# Patient Record
Sex: Female | Born: 1995 | Race: White | Hispanic: No | Marital: Single | Smoking: Never smoker
Health system: Southern US, Community
[De-identification: ages and names within clinical notes are randomized; demographics above are authoritative.]

## PROBLEM LIST (undated history)

## (undated) DIAGNOSIS — F32A Depression, unspecified: Secondary | ICD-10-CM

## (undated) DIAGNOSIS — E282 Polycystic ovarian syndrome: Secondary | ICD-10-CM

## (undated) DIAGNOSIS — F329 Major depressive disorder, single episode, unspecified: Secondary | ICD-10-CM

---
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
## 2022-09-09 ENCOUNTER — Encounter (HOSPITAL_BASED_OUTPATIENT_CLINIC_OR_DEPARTMENT_OTHER): Payer: Self-pay | Admitting: Emergency Medicine

## 2022-09-09 ENCOUNTER — Other Ambulatory Visit (HOSPITAL_BASED_OUTPATIENT_CLINIC_OR_DEPARTMENT_OTHER): Payer: Self-pay

## 2022-09-09 ENCOUNTER — Emergency Department (HOSPITAL_COMMUNITY): Payer: BC Managed Care – PPO | Admitting: Anesthesiology

## 2022-09-09 ENCOUNTER — Other Ambulatory Visit: Payer: Self-pay

## 2022-09-09 ENCOUNTER — Encounter (HOSPITAL_COMMUNITY)
Admission: EM | Disposition: A | Discharge status: Home / Self Care | Payer: Self-pay | Source: Home / Self Care | Attending: Emergency Medicine

## 2022-09-09 ENCOUNTER — Observation Stay (HOSPITAL_BASED_OUTPATIENT_CLINIC_OR_DEPARTMENT_OTHER)
Admission: EM | Admit: 2022-09-09 | Discharge: 2022-09-10 | Disposition: A | Discharge status: Home / Self Care | Payer: BC Managed Care – PPO | Attending: Surgery | Admitting: Surgery

## 2022-09-09 ENCOUNTER — Emergency Department (HOSPITAL_BASED_OUTPATIENT_CLINIC_OR_DEPARTMENT_OTHER): Payer: BC Managed Care – PPO

## 2022-09-09 ENCOUNTER — Observation Stay (HOSPITAL_COMMUNITY): Payer: BC Managed Care – PPO

## 2022-09-09 DIAGNOSIS — R1010 Upper abdominal pain, unspecified: Principal | ICD-10-CM

## 2022-09-09 DIAGNOSIS — K8012 Calculus of gallbladder with acute and chronic cholecystitis without obstruction: Principal | ICD-10-CM | POA: Insufficient documentation

## 2022-09-09 DIAGNOSIS — K819 Cholecystitis, unspecified: Secondary | ICD-10-CM | POA: Diagnosis not present

## 2022-09-09 DIAGNOSIS — K810 Acute cholecystitis: Secondary | ICD-10-CM | POA: Diagnosis present

## 2022-09-09 DIAGNOSIS — K8020 Calculus of gallbladder without cholecystitis without obstruction: Secondary | ICD-10-CM

## 2022-09-09 DIAGNOSIS — R7989 Other specified abnormal findings of blood chemistry: Secondary | ICD-10-CM

## 2022-09-09 DIAGNOSIS — R1011 Right upper quadrant pain: Secondary | ICD-10-CM | POA: Diagnosis present

## 2022-09-09 HISTORY — PX: CHOLECYSTECTOMY: SHX55

## 2022-09-09 LAB — COMPREHENSIVE METABOLIC PANEL
ALT: 175 U/L — ABNORMAL HIGH (ref 0–44)
AST: 253 U/L — ABNORMAL HIGH (ref 15–41)
Albumin: 4.5 g/dL (ref 3.5–5.0)
Alkaline Phosphatase: 79 U/L (ref 38–126)
Anion gap: 11 (ref 5–15)
BUN: 5 mg/dL — ABNORMAL LOW (ref 6–20)
CO2: 23 mmol/L (ref 22–32)
Calcium: 9.6 mg/dL (ref 8.9–10.3)
Chloride: 104 mmol/L (ref 98–111)
Creatinine, Ser: 0.58 mg/dL (ref 0.44–1.00)
GFR, Estimated: >60 mL/min (ref >60)
Glucose, Bld: 114 mg/dL — ABNORMAL HIGH (ref 70–99)
Potassium: 3.9 mmol/L (ref 3.5–5.1)
Sodium: 138 mmol/L (ref 135–145)
Total Bilirubin: 1.4 mg/dL — ABNORMAL HIGH (ref 0.3–1.2)
Total Protein: 7.4 g/dL (ref 6.5–8.1)

## 2022-09-09 LAB — URINALYSIS, ROUTINE W REFLEX MICROSCOPIC
Bilirubin Urine: NEGATIVE
Glucose, UA: NEGATIVE mg/dL
Hgb urine dipstick: NEGATIVE
Ketones, ur: 40 mg/dL — AB
Nitrite: NEGATIVE
Protein, ur: 30 mg/dL — AB
Specific Gravity, Urine: 1.023 (ref 1.005–1.030)
pH: 8.5 — ABNORMAL HIGH (ref 5.0–8.0)

## 2022-09-09 LAB — CBC
HCT: 41.5 % (ref 36.0–46.0)
Hemoglobin: 14.2 g/dL (ref 12.0–15.0)
MCH: 29.5 pg (ref 26.0–34.0)
MCHC: 34.2 g/dL (ref 30.0–36.0)
MCV: 86.3 fL (ref 80.0–100.0)
Platelets: 156 10*3/uL (ref 150–400)
RBC: 4.81 MIL/uL (ref 3.87–5.11)
RDW: 12.8 % (ref 11.5–15.5)
WBC: 7.5 10*3/uL (ref 4.0–10.5)
nRBC: 0 % (ref 0.0–0.2)

## 2022-09-09 LAB — RAPID URINE DRUG SCREEN, HOSP PERFORMED
Amphetamines: NOT DETECTED
Barbiturates: NOT DETECTED
Benzodiazepines: NOT DETECTED
Cocaine: NOT DETECTED
Opiates: NOT DETECTED
Tetrahydrocannabinol: POSITIVE — AB

## 2022-09-09 LAB — PREGNANCY, URINE: Preg Test, Ur: NEGATIVE

## 2022-09-09 LAB — LIPASE, BLOOD: Lipase: 16 U/L (ref 11–51)

## 2022-09-09 SURGERY — LAPAROSCOPIC CHOLECYSTECTOMY WITH INTRAOPERATIVE CHOLANGIOGRAM
Anesthesia: General

## 2022-09-09 MED ORDER — ONDANSETRON 4 MG PO TBDP: 4.0000 mg | ORAL_TABLET | Freq: Four times a day (QID) | ORAL | Status: DC | PRN

## 2022-09-09 MED ORDER — BUPIVACAINE HCL 0.25 % IJ SOLN
INTRAMUSCULAR | Status: DC | PRN
  Administered 2022-09-09: 30 mL

## 2022-09-09 MED ORDER — SIMETHICONE 80 MG PO CHEW: 40.0000 mg | CHEWABLE_TABLET | Freq: Four times a day (QID) | ORAL | Status: DC | PRN

## 2022-09-09 MED ORDER — OXYCODONE HCL 5 MG/5ML PO SOLN: 5.0000 mg | Freq: Once | ORAL | Status: DC | PRN

## 2022-09-09 MED ORDER — KETOROLAC TROMETHAMINE 30 MG/ML IJ SOLN: 30.0000 mg | Freq: Once | INTRAMUSCULAR | Status: DC | PRN

## 2022-09-09 MED ORDER — SCOPOLAMINE 1 MG/3DAYS TD PT72
MEDICATED_PATCH | TRANSDERMAL | Status: AC
  Administered 2022-09-09: 1.5 mg via TRANSDERMAL
  Filled 2022-09-09: qty 1

## 2022-09-09 MED ORDER — METOPROLOL TARTRATE 5 MG/5ML IV SOLN: 5.0000 mg | Freq: Four times a day (QID) | INTRAVENOUS | Status: DC | PRN

## 2022-09-09 MED ORDER — AMISULPRIDE (ANTIEMETIC) 5 MG/2ML IV SOLN: 10.0000 mg | Freq: Once | INTRAVENOUS | Status: DC | PRN

## 2022-09-09 MED ORDER — MORPHINE SULFATE (PF) 2 MG/ML IV SOLN
1.0000 mg | INTRAVENOUS | Status: DC | PRN
  Administered 2022-09-09: 2 mg via INTRAVENOUS
  Filled 2022-09-09: qty 1

## 2022-09-09 MED ORDER — MIDAZOLAM HCL 2 MG/2ML IJ SOLN
INTRAMUSCULAR | Status: AC
  Filled 2022-09-09: qty 2

## 2022-09-09 MED ORDER — MELATONIN 3 MG PO TABS: 3.0000 mg | ORAL_TABLET | Freq: Every evening | ORAL | Status: DC | PRN

## 2022-09-09 MED ORDER — DIPHENHYDRAMINE HCL 25 MG PO CAPS: 25.0000 mg | ORAL_CAPSULE | Freq: Four times a day (QID) | ORAL | Status: DC | PRN

## 2022-09-09 MED ORDER — CEFAZOLIN SODIUM-DEXTROSE 2-4 GM/100ML-% IV SOLN
2.0000 g | Freq: Once | INTRAVENOUS | Status: AC
  Administered 2022-09-09: 2 g via INTRAVENOUS
  Filled 2022-09-09 (×2): qty 100

## 2022-09-09 MED ORDER — ONDANSETRON HCL 4 MG/2ML IJ SOLN
4.0000 mg | Freq: Four times a day (QID) | INTRAMUSCULAR | Status: DC | PRN
  Administered 2022-09-09: 4 mg via INTRAVENOUS
  Filled 2022-09-09: qty 2

## 2022-09-09 MED ORDER — SODIUM CHLORIDE 0.9 % IV SOLN
INTRAVENOUS | Status: DC | PRN
  Administered 2022-09-09: 7 mL

## 2022-09-09 MED ORDER — ORAL CARE MOUTH RINSE: 15.0000 mL | Freq: Once | OROMUCOSAL | Status: AC

## 2022-09-09 MED ORDER — KCL IN DEXTROSE-NACL 20-5-0.45 MEQ/L-%-% IV SOLN
INTRAVENOUS | Status: DC
  Filled 2022-09-09 (×2): qty 1000

## 2022-09-09 MED ORDER — PROPOFOL 10 MG/ML IV BOLUS
INTRAVENOUS | Status: DC | PRN
  Administered 2022-09-09: 150 mg via INTRAVENOUS

## 2022-09-09 MED ORDER — CHLORHEXIDINE GLUCONATE 0.12 % MT SOLN
15.0000 mL | Freq: Once | OROMUCOSAL | Status: AC
  Administered 2022-09-09: 15 mL via OROMUCOSAL
  Filled 2022-09-09: qty 15

## 2022-09-09 MED ORDER — HYDRALAZINE HCL 20 MG/ML IJ SOLN: 10.0000 mg | INTRAMUSCULAR | Status: DC | PRN

## 2022-09-09 MED ORDER — MORPHINE SULFATE (PF) 4 MG/ML IV SOLN
4.0000 mg | Freq: Once | INTRAVENOUS | Status: AC
  Administered 2022-09-09: 4 mg via INTRAVENOUS
  Filled 2022-09-09: qty 1

## 2022-09-09 MED ORDER — OXYCODONE HCL 5 MG PO TABS: 5.0000 mg | ORAL_TABLET | ORAL | Status: DC | PRN

## 2022-09-09 MED ORDER — POLYETHYLENE GLYCOL 3350 17 G PO PACK: 17.0000 g | PACK | Freq: Every day | ORAL | Status: DC | PRN

## 2022-09-09 MED ORDER — FENTANYL CITRATE (PF) 250 MCG/5ML IJ SOLN
INTRAMUSCULAR | Status: DC | PRN
  Administered 2022-09-09: 100 ug via INTRAVENOUS
  Administered 2022-09-09: 150 ug via INTRAVENOUS

## 2022-09-09 MED ORDER — LACTATED RINGERS IV BOLUS
1000.0000 mL | Freq: Once | INTRAVENOUS | Status: AC
  Administered 2022-09-09: 1000 mL via INTRAVENOUS

## 2022-09-09 MED ORDER — DEXMEDETOMIDINE HCL IN NACL 80 MCG/20ML IV SOLN
INTRAVENOUS | Status: DC | PRN
  Administered 2022-09-09: 12 ug via INTRAVENOUS
  Administered 2022-09-09: 8 ug via INTRAVENOUS

## 2022-09-09 MED ORDER — ONDANSETRON HCL 4 MG/2ML IJ SOLN
4.0000 mg | Freq: Once | INTRAMUSCULAR | Status: AC
  Administered 2022-09-09: 4 mg via INTRAVENOUS
  Filled 2022-09-09: qty 2

## 2022-09-09 MED ORDER — OXYCODONE HCL 5 MG PO TABS: 5.0000 mg | ORAL_TABLET | Freq: Once | ORAL | Status: DC | PRN

## 2022-09-09 MED ORDER — ACETAMINOPHEN 500 MG PO TABS
1000.0000 mg | ORAL_TABLET | Freq: Four times a day (QID) | ORAL | Status: DC
  Administered 2022-09-09 – 2022-09-10 (×3): 1000 mg via ORAL
  Filled 2022-09-09 (×4): qty 2

## 2022-09-09 MED ORDER — SCOPOLAMINE 1 MG/3DAYS TD PT72: 1.0000 | MEDICATED_PATCH | Freq: Once | TRANSDERMAL | Status: AC

## 2022-09-09 MED ORDER — BUPIVACAINE HCL (PF) 0.25 % IJ SOLN
INTRAMUSCULAR | Status: AC
  Filled 2022-09-09: qty 30

## 2022-09-09 MED ORDER — DEXAMETHASONE SODIUM PHOSPHATE 10 MG/ML IJ SOLN
INTRAMUSCULAR | Status: DC | PRN
  Administered 2022-09-09: 10 mg via INTRAVENOUS

## 2022-09-09 MED ORDER — MIDAZOLAM HCL 2 MG/2ML IJ SOLN
INTRAMUSCULAR | Status: DC | PRN
  Administered 2022-09-09: 2 mg via INTRAVENOUS

## 2022-09-09 MED ORDER — ROCURONIUM BROMIDE 100 MG/10ML IV SOLN
INTRAVENOUS | Status: DC | PRN
  Administered 2022-09-09: 10 mg via INTRAVENOUS
  Administered 2022-09-09: 50 mg via INTRAVENOUS

## 2022-09-09 MED ORDER — SUGAMMADEX SODIUM 200 MG/2ML IV SOLN
INTRAVENOUS | Status: DC | PRN
  Administered 2022-09-09: 200 mg via INTRAVENOUS

## 2022-09-09 MED ORDER — FENTANYL CITRATE (PF) 100 MCG/2ML IJ SOLN: 25.0000 ug | INTRAMUSCULAR | Status: DC | PRN

## 2022-09-09 MED ORDER — KETOROLAC TROMETHAMINE 30 MG/ML IJ SOLN
30.0000 mg | Freq: Four times a day (QID) | INTRAMUSCULAR | Status: DC
  Administered 2022-09-09 – 2022-09-10 (×3): 30 mg via INTRAVENOUS
  Filled 2022-09-09 (×3): qty 1

## 2022-09-09 MED ORDER — CELECOXIB 200 MG PO CAPS
200.0000 mg | ORAL_CAPSULE | ORAL | Status: AC
  Administered 2022-09-09: 200 mg via ORAL
  Filled 2022-09-09: qty 1

## 2022-09-09 MED ORDER — ENOXAPARIN SODIUM 40 MG/0.4ML IJ SOSY: 40.0000 mg | PREFILLED_SYRINGE | INTRAMUSCULAR | Status: DC

## 2022-09-09 MED ORDER — LIDOCAINE 2% (20 MG/ML) 5 ML SYRINGE
INTRAMUSCULAR | Status: DC | PRN
  Administered 2022-09-09: 60 mg via INTRAVENOUS

## 2022-09-09 MED ORDER — DIPHENHYDRAMINE HCL 50 MG/ML IJ SOLN: 25.0000 mg | Freq: Four times a day (QID) | INTRAMUSCULAR | Status: DC | PRN

## 2022-09-09 MED ORDER — FENTANYL CITRATE (PF) 250 MCG/5ML IJ SOLN
INTRAMUSCULAR | Status: AC
  Filled 2022-09-09: qty 5

## 2022-09-09 MED ORDER — LACTATED RINGERS IV SOLN: INTRAVENOUS | Status: DC

## 2022-09-09 MED ORDER — PROMETHAZINE HCL 25 MG/ML IJ SOLN: 6.2500 mg | INTRAMUSCULAR | Status: DC | PRN

## 2022-09-09 MED ORDER — ONDANSETRON HCL 4 MG/2ML IJ SOLN
INTRAMUSCULAR | Status: DC | PRN
  Administered 2022-09-09: 4 mg via INTRAVENOUS

## 2022-09-09 MED ORDER — ACETAMINOPHEN 500 MG PO TABS
1000.0000 mg | ORAL_TABLET | ORAL | Status: AC
  Administered 2022-09-09: 1000 mg via ORAL
  Filled 2022-09-09: qty 2

## 2022-09-09 SURGICAL SUPPLY — 55 items
ADH SKN CLS APL DERMABOND .7 (GAUZE/BANDAGES/DRESSINGS) ×1
APL PRP STRL LF DISP 70% ISPRP (MISCELLANEOUS) ×1
APPLIER CLIP ROT 10 11.4 M/L (STAPLE) ×1
APR CLP MED LRG 11.4X10 (STAPLE) ×1
BAG COUNTER SPONGE SURGICOUNT (BAG) ×1 IMPLANT
BAG SPEC RTRVL 10 TROC 200 (ENDOMECHANICALS) ×1
BAG SPNG CNTER NS LX DISP (BAG) ×1
CANISTER SUCT 3000ML PPV (MISCELLANEOUS) ×1 IMPLANT
CATH URET 5FR 28IN OPEN ENDED (CATHETERS) ×1 IMPLANT
CATH URETL OPEN 5X70 (CATHETERS) ×1 IMPLANT
CATH URETL OPEN END 6FR 70 (CATHETERS) ×1 IMPLANT
CHLORAPREP W/TINT 26 (MISCELLANEOUS) ×1 IMPLANT
CLIP APPLIE ROT 10 11.4 M/L (STAPLE) ×1 IMPLANT
COVER MAYO STAND STRL (DRAPES) IMPLANT
COVER SURGICAL LIGHT HANDLE (MISCELLANEOUS) ×1 IMPLANT
DERMABOND ADVANCED .7 DNX12 (GAUZE/BANDAGES/DRESSINGS) ×1 IMPLANT
DRAPE C-ARM 42X120 X-RAY (DRAPES) ×1 IMPLANT
ELECT REM PT RETURN 9FT ADLT (ELECTROSURGICAL) ×1
ELECTRODE REM PT RTRN 9FT ADLT (ELECTROSURGICAL) ×1 IMPLANT
ENDOLOOP SUT PDS II 0 18 (SUTURE) IMPLANT
GLOVE BIO SURGEON STRL SZ7.5 (GLOVE) ×1 IMPLANT
GLOVE BIOGEL PI IND STRL 8 (GLOVE) ×1 IMPLANT
GOWN STRL REUS W/ TWL LRG LVL3 (GOWN DISPOSABLE) ×2 IMPLANT
GOWN STRL REUS W/ TWL XL LVL3 (GOWN DISPOSABLE) ×1 IMPLANT
GOWN STRL REUS W/TWL LRG LVL3 (GOWN DISPOSABLE) ×2
GOWN STRL REUS W/TWL XL LVL3 (GOWN DISPOSABLE) ×1
GRASPER SUT TROCAR 14GX15 (MISCELLANEOUS) ×1 IMPLANT
IRRIG SUCT STRYKERFLOW 2 WTIP (MISCELLANEOUS) ×1
IRRIGATION SUCT STRKRFLW 2 WTP (MISCELLANEOUS) ×1 IMPLANT
IV CATH 14GX2 1/4 (CATHETERS) ×1 IMPLANT
IV CATH AUTO 14GX1.75 SAFE ORG (IV SOLUTION) ×1 IMPLANT
KIT BASIN OR (CUSTOM PROCEDURE TRAY) ×1 IMPLANT
KIT IMAGING PINPOINTPAQ (MISCELLANEOUS) IMPLANT
KIT TURNOVER KIT B (KITS) ×1 IMPLANT
NDL 22X1.5 STRL (OR ONLY) (MISCELLANEOUS) ×1 IMPLANT
NDL INSUFFLATION 14GA 120MM (NEEDLE) ×1 IMPLANT
NEEDLE 22X1.5 STRL (OR ONLY) (MISCELLANEOUS) ×1 IMPLANT
NEEDLE INSUFFLATION 14GA 120MM (NEEDLE) ×1 IMPLANT
NS IRRIG 1000ML POUR BTL (IV SOLUTION) ×1 IMPLANT
PAD ARMBOARD 7.5X6 YLW CONV (MISCELLANEOUS) ×1 IMPLANT
POUCH RETRIEVAL ECOSAC 10 (ENDOMECHANICALS) ×1 IMPLANT
SCISSORS LAP 5X35 DISP (ENDOMECHANICALS) ×1 IMPLANT
SET CHOLANGIOGRAPH 5 50 .035 (SET/KITS/TRAYS/PACK) ×1 IMPLANT
SET TUBE SMOKE EVAC HIGH FLOW (TUBING) ×1 IMPLANT
SLEEVE Z-THREAD 5X100MM (TROCAR) ×2 IMPLANT
SPECIMEN JAR SMALL (MISCELLANEOUS) ×1 IMPLANT
STOPCOCK 4 WAY LG BORE MALE ST (IV SETS) ×1 IMPLANT
SUT MNCRL AB 4-0 PS2 18 (SUTURE) ×1 IMPLANT
TOWEL GREEN STERILE (TOWEL DISPOSABLE) ×1 IMPLANT
TOWEL GREEN STERILE FF (TOWEL DISPOSABLE) ×1 IMPLANT
TRAY LAPAROSCOPIC MC (CUSTOM PROCEDURE TRAY) ×1 IMPLANT
TROCAR 11X100 Z THREAD (TROCAR) ×1 IMPLANT
TROCAR Z-THREAD OPTICAL 5X100M (TROCAR) ×1 IMPLANT
WARMER LAPAROSCOPE (MISCELLANEOUS) ×1 IMPLANT
WATER STERILE IRR 1000ML POUR (IV SOLUTION) ×1 IMPLANT

## 2022-09-09 NOTE — Progress Notes (Signed)
Pt roomed to floor at 1830hrs. Very drowsy but responds to voice

## 2022-09-09 NOTE — Transfer of Care (Signed)
Immediate Anesthesia Transfer of Care Note  Patient: Debra Krueger  Procedure(s) Performed: LAPAROSCOPIC CHOLECYSTECTOMY WITH INTRAOPERATIVE CHOLANGIOGRAM  Patient Location: PACU  Anesthesia Type:General  Level of Consciousness: drowsy  Airway & Oxygen Therapy: Patient Spontanous Breathing  Post-op Assessment: Report given to RN and Post -op Vital signs reviewed and stable  Post vital signs: Reviewed and stable  Last Vitals:  Vitals Value Taken Time  BP 118/61 09/09/22 1745  Temp    Pulse 98 09/09/22 1746  Resp 21 09/09/22 1746  SpO2 94 % 09/09/22 1746  Vitals shown include unfiled device data.  Last Pain:  Vitals:   09/09/22 1459  TempSrc: Oral  PainSc: 0-No pain         Complications: No notable events documented.

## 2022-09-09 NOTE — Op Note (Signed)
Patient: Debra Krueger ([birth year], [id number])  Date of Surgery: 09/09/2022   Preoperative Diagnosis: CHOLECYSTITIS   Postoperative Diagnosis: CHOLECYSTITIS   Surgical Procedure: LAPAROSCOPIC CHOLECYSTECTOMY WITH INTRAOPERATIVE CHOLANGIOGRAM: 38756 (CPT)   Operative Team Members:  Surgeons and Role:    * Naleyah Ohlinger, Hyman Hopes, MD - Primary    * Hillery Hunter Lucilla Edin, MD - Assisting   Anesthesiologist: Leonides Grills, MD CRNA: Marena Chancy, CRNA; Randon Goldsmith, CRNA   Anesthesia: General   Fluids:  Total I/O In: 1100 [I.V.:1000; IV Piggyback:100] Out: 25 [Blood:25]  Complications: * No complications entered in OR log *  Drains:  none   Specimen:  ID Type Source Tests Collected by Time Destination  1 : GALLBLADDER Tissue PATH Gallbladder SURGICAL PATHOLOGY Jomarion Mish, Hyman Hopes, MD 09/09/2022 1611      Disposition:  PACU - hemodynamically stable.  Plan of Care: Admit for overnight observation    Indications for Procedure: ADONICA SOUTHWORTH is a 27 y.o. female who presented with abdominal pain.  History, physical and imaging was concerning for cholecystitis.  Laparoscopic cholecystectomy was recommended for the patient.  The procedure itself, as well as the risks, benefits and alternatives were discussed with the patient.  Risks discussed included but were not limited to the risk of infection, bleeding, damage to nearby structures, need to convert to open procedure, incisional hernia, bile leak, common bile duct injury and the need for additional procedures or surgeries.  With this discussion complete and all questions answered the patient granted consent to proceed.  Findings: Cholangiogram with filling defect that looked to be a fast moving bubble up towards the liver, normal drainage otherwise.  Infection status: Patient: [facility] Patient Case: Urgent Infection Present At Time Of Surgery (PATOS):  Inflamed  gallbladder   Description of Procedure:   On the date stated above, the patient was taken to the operating room suite and placed in supine positioning.  Sequential compression devices were placed on the lower extremities to prevent blood clots.  General endotracheal anesthesia was induced. Preoperative antibiotics were given.  The patient's abdomen was prepped and draped in the usual sterile fashion.  A time-out was completed verifying the correct patient, procedure, positioning and equipment needed for the case.  We began by anesthetizing the skin with local anesthetic and then making a 5 mm incision just below the umbilicus.  We dissected through the subcutaneous tissues to the fascia.  The fascia was grasped and elevated using a Kocher clamp.  A Veress needle was inserted into the abdomen and the abdomen was insufflated to 15 mmHg.  A 5 mm trocar was inserted in this position under optical guidance and then the abdomen was inspected.  There was no trauma to the underlying viscera with initial trocar placement.  Any abnormal findings, other than inflammation in the right upper quadrant, are listed above in the findings section.  Three additional trocars were placed, one 12 mm trocar in the subxiphoid position, one 5 mm trocar in the midline epigastric area and one 5mm trocar in the right upper quadrant subcostally.  These were placed under direct vision without any trauma to the underlying viscera.    The patient was then placed in head up, left side down positioning.  The gallbladder was identified and dissected free from its attachments to the omentum allowing the duodenum to fall away.  The infundibulum of the gallbladder was dissected free working laterally to medially.  The cystic duct and cystic artery  were dissected free from surrounding connective tissue.  The infundibulum of the gallbladder was dissected off the cystic plate.  A critical view of safety was obtained with the cystic duct and cystic  artery being cleared of connective tissues and clearly the only two structures entering into the gallbladder with the liver clearly visible behind.  One clip was applied high on the cystic duct.  A small ductotomy was created below this using the endoscopic shears.  A cholangiogram catheter was introduced through the abdominal wall and into the cystic duct through this ductotomy.  The catheter was clipped into position.  The catheter was flushed to ensure no leakage around the clip.  We then removed the laparoscopic instruments and positioned the C-Arm to perform a cholangiogram.  The catheter was flushed with contrast under fluoroscopic visualization and a cholangiogram was obtained.  The cholangiogram visualized the biliary tree from the ampulla up to the first two biliary radicals in the liver.  There were fast moving filling defects that went towards the liver, they looked to be bubbles.  There were no other filling defects.  The catheter clearly entered the cystic duct.  There was gradual tapering of the common bile duct down to the ampulla without evidence of stricture or other abnormalities.  Please see the EMR for saved representative images.  With our cholangiogram compete, we moved the c-arm away from the field and returned to laparoscopic surgery.    Clips were then applied to the cystic duct and cystic artery and then these structures were divided.  A PDS endoloop was placed to secure the cystic duct.  The gallbladder was dissected off the cystic plate, placed in an endocatch bag and removed from the 12 mm subxiphoid port site.  The clips were inspected and appeared effective.  The cystic plate was inspected and hemostasis was obtained using electrocautery.  A suction irrigator was used to clean the operative field.  Attention was turned to closure.  The 12 mm subxiphoid port site was closed using a 0-vicryl suture on a fascial suture passer.  The abdomen was desufflated.  The skin was closed using 4-0  monocryl and dermabond.  All sponge and needle counts were correct at the conclusion of the case.    Ivar Drape, MD General, Bariatric, & Minimally Invasive Surgery [facility], [place]

## 2022-09-09 NOTE — ED Notes (Signed)
Per Merlinda Frederick, PA-C patient can travel POV and arrive at the preop area at [facility]. Pt agreeing to having her mother drive her to another hospital. Dr. Denton Lank made aware of Barnetta Chapel, PA stating patient can transfer by POV. Dr. Denton Lank made aware patient needs order to travel by POV and if IV can stay in an order is also needed.

## 2022-09-09 NOTE — Anesthesia Postprocedure Evaluation (Signed)
Anesthesia Post Note  Patient: Harlen Labs  Procedure(s) Performed: LAPAROSCOPIC CHOLECYSTECTOMY WITH INTRAOPERATIVE CHOLANGIOGRAM     Patient location during evaluation: PACU Anesthesia Type: General Level of consciousness: awake Pain management: pain level controlled Vital Signs Assessment: post-procedure vital signs reviewed and stable Respiratory status: spontaneous breathing, nonlabored ventilation and respiratory function stable Cardiovascular status: blood pressure returned to baseline and stable Postop Assessment: no apparent nausea or vomiting Anesthetic complications: no   No notable events documented.  Last Vitals:  Vitals:   09/09/22 1800 09/09/22 1815  BP: 108/61 108/71  Pulse: 65 62  Resp: 13 11  Temp:  36.5 C  SpO2: 98% 97%    Last Pain:  Vitals:   09/09/22 1815  TempSrc:   PainSc: 0-No pain                 Arien Morine P Kehaulani Fruin

## 2022-09-09 NOTE — Anesthesia Preprocedure Evaluation (Addendum)
Anesthesia Evaluation  Patient identified by MRN, date of birth, ID band Patient awake    Reviewed: Allergy & Precautions, NPO status , Patient's Chart, lab work & pertinent test results  Airway Mallampati: III  TM Distance: >3 FB Neck ROM: Full    Dental no notable dental hx.    Pulmonary  Hoarseness    Pulmonary exam normal        Cardiovascular negative cardio ROS Normal cardiovascular exam     Neuro/Psych  PSYCHIATRIC DISORDERS  Depression    negative neurological ROS     GI/Hepatic ,,,(+)     substance abuse  Chronic reflux with vocal cord involvement   Endo/Other  PCOS (polycystic ovarian syndrome)  Renal/GU negative Renal ROS     Musculoskeletal negative musculoskeletal ROS (+)    Abdominal   Peds  Hematology negative hematology ROS (+)   Anesthesia Other Findings CHOLECYSTITIS  Reproductive/Obstetrics Hcg negative                             Anesthesia Physical Anesthesia Plan  ASA: 2  Anesthesia Plan: General   Post-op Pain Management:    Induction: Intravenous  PONV Risk Score and Plan: 4 or greater and Ondansetron, Dexamethasone, Midazolam, Scopolamine patch - Pre-op and Treatment may vary due to age or medical condition  Airway Management Planned: Oral ETT  Additional Equipment:   Intra-op Plan:   Post-operative Plan: Extubation in OR  Informed Consent: I have reviewed the patients History and Physical, chart, labs and discussed the procedure including the risks, benefits and alternatives for the proposed anesthesia with the patient or authorized representative who has indicated his/her understanding and acceptance.     Dental advisory given  Plan Discussed with: CRNA  Anesthesia Plan Comments:        Anesthesia Quick Evaluation

## 2022-09-09 NOTE — ED Notes (Addendum)
Pt given written instructions to go to [facility]. Go to the main entrance and let the front desk know that she needs to go to preop for surgery today. Pt and patient mother verbalized understanding. Pt leaving POV with her mom driving her to [facility]. Pt IV still intact.

## 2022-09-09 NOTE — H&P (Signed)
H&P Note  Debra Krueger 1995/12/20  [id number].    Requesting MD: Cathren Laine, MD Chief Complaint/Reason for Consult: cholelithiasis HPI:  Patient is a 27 year old female who presented to the ED with abdominal pain since 3 AM. Pain is in RUQ and dull, radiates to the back. Similar previous episodes of pain. Has seen GI as an outpatient. PMH otherwise significant for PCOS and depression. No prior abdominal surgery. No blood thinners. Allergic to PCNs and omeprazole. Works as an Education officer, community at [employer]. Lives with her parents.   ROS: Negative other than HPI  Family History  Problem Relation Age of Onset   Anxiety disorder Mother    Depression Mother    Depression Father     Past Medical History:  Diagnosis Date   Depression    PCOS (polycystic ovarian syndrome)     History reviewed. No pertinent surgical history.  Social History:  reports that she has never smoked. She does not have any smokeless tobacco history on file. She reports that she does not drink alcohol and does not use drugs.  Allergies:  Allergies  Allergen Reactions   Amoxicillin Hives   Omeprazole Hives   Penicillins Hives    (Not in a hospital admission)   Blood pressure (!) 136/92, pulse 60, temperature 98.2 F (36.8 C), temperature source Oral, resp. rate 15, height 5\' 6"  (1.676 m), weight 82.1 kg, SpO2 100%. Physical Exam:  General: pleasant, WD, WN female who is laying in bed in NAD HEENT: head is normocephalic, atraumatic.  Sclera are anicteric.  Ears and nose without any masses or lesions.  Mouth is pink and moist Heart: regular, rate, and rhythm.   Lungs:  Respiratory effort nonlabored Abd: soft, NT, ND, no masses, hernias, or organomegaly MS: all 4 extremities are symmetrical with no cyanosis, clubbing, or edema. Skin: warm and dry with no masses, lesions, or rashes Neuro: Cranial nerves 2-12 grossly intact, sensation is normal throughout Psych: A&Ox3 with  an appropriate affect.   Results for orders placed or performed during the hospital encounter of 09/09/22 (from the past 48 hour(s))  Lipase, blood     Status: None   Collection Time: 09/09/22  8:50 AM  Result Value Ref Range   Lipase 16 11 - 51 U/L    Comment: Performed at [facility], [address] [place], [place] [address]  Comprehensive metabolic panel     Status: Abnormal   Collection Time: 09/09/22  8:50 AM  Result Value Ref Range   Sodium 138 135 - 145 mmol/L   Potassium 3.9 3.5 - 5.1 mmol/L   Chloride 104 98 - 111 mmol/L   CO2 23 22 - 32 mmol/L   Glucose, Bld 114 (H) 70 - 99 mg/dL    Comment: Glucose reference range applies only to samples taken after fasting for at least 8 hours.   BUN 5 (L) 6 - 20 mg/dL   Creatinine, Ser 6.21 0.44 - 1.00 mg/dL   Calcium 9.6 8.9 - 30.8 mg/dL   Total Protein 7.4 6.5 - 8.1 g/dL   Albumin 4.5 3.5 - 5.0 g/dL   AST 657 (H) 15 - 41 U/L   ALT 175 (H) 0 - 44 U/L   Alkaline Phosphatase 79 38 - 126 U/L   Total Bilirubin 1.4 (H) 0.3 - 1.2 mg/dL   GFR, Estimated >84 >69 mL/min    Comment: (NOTE) Calculated using the CKD-EPI Creatinine Equation (2021)    Anion  gap 11 5 - 15    Comment: Performed at [facility], [address] [place], [place] [address]  CBC     Status: None   Collection Time: 09/09/22  8:50 AM  Result Value Ref Range   WBC 7.5 4.0 - 10.5 K/uL   RBC 4.81 3.87 - 5.11 MIL/uL   Hemoglobin 14.2 12.0 - 15.0 g/dL   HCT 19.1 47.8 - 29.5 %   MCV 86.3 80.0 - 100.0 fL   MCH 29.5 26.0 - 34.0 pg   MCHC 34.2 30.0 - 36.0 g/dL   RDW 62.1 30.8 - 65.7 %   Platelets 156 150 - 400 K/uL   nRBC 0.0 0.0 - 0.2 %    Comment: Performed at [facility], [address], [place], [place] [address]  Urinalysis, Routine w reflex microscopic -Urine, Clean Catch     Status: Abnormal   Collection Time: 09/09/22  9:17 AM  Result Value Ref Range   Color, Urine YELLOW YELLOW   APPearance  HAZY (A) CLEAR   Specific Gravity, Urine 1.023 1.005 - 1.030   pH 8.5 (H) 5.0 - 8.0   Glucose, UA NEGATIVE NEGATIVE mg/dL   Hgb urine dipstick NEGATIVE NEGATIVE   Bilirubin Urine NEGATIVE NEGATIVE   Ketones, ur 40 (A) NEGATIVE mg/dL   Protein, ur 30 (A) NEGATIVE mg/dL   Nitrite NEGATIVE NEGATIVE   Leukocytes,Ua TRACE (A) NEGATIVE   RBC / HPF 0-5 0 - 5 RBC/hpf   WBC, UA 6-10 0 - 5 WBC/hpf   Bacteria, UA RARE (A) NONE SEEN   Squamous Epithelial / HPF 6-10 0 - 5 /HPF   Mucus PRESENT    Crystals PRESENT (A) NEGATIVE    Comment: Performed at [facility], [address] [place], [place] [address]  Pregnancy, urine     Status: None   Collection Time: 09/09/22  9:17 AM  Result Value Ref Range   Preg Test, Ur NEGATIVE NEGATIVE    Comment:        THE SENSITIVITY OF THIS METHODOLOGY IS >25 mIU/mL. Performed at [facility], [address], [place], [place] [address]   Rapid urine drug screen (hospital performed)     Status: Abnormal   Collection Time: 09/09/22  9:17 AM  Result Value Ref Range   Opiates NONE DETECTED NONE DETECTED   Cocaine NONE DETECTED NONE DETECTED   Benzodiazepines NONE DETECTED NONE DETECTED   Amphetamines NONE DETECTED NONE DETECTED   Tetrahydrocannabinol POSITIVE (A) NONE DETECTED   Barbiturates NONE DETECTED NONE DETECTED    Comment: (NOTE) DRUG SCREEN FOR MEDICAL PURPOSES ONLY.  IF CONFIRMATION IS NEEDED FOR ANY PURPOSE, NOTIFY LAB WITHIN 5 DAYS.  LOWEST DETECTABLE LIMITS FOR URINE DRUG SCREEN Drug Class                     Cutoff (ng/mL) Amphetamine and metabolites    1000 Barbiturate and metabolites    200 Benzodiazepine                 200 Opiates and metabolites        300 Cocaine and metabolites        300 THC                            50 Performed at [facility], [address] [place], [place] [address]    US Abdomen Limited RUQ (LIVER/GB)  Result Date: 09/09/2022  CLINICAL  DATA:  Abdominal pain since 0300 hours EXAM: ULTRASOUND ABDOMEN LIMITED RIGHT UPPER QUADRANT COMPARISON:  CT abdomen 10/16/2013 FINDINGS: Gallbladder: Gallstones: 0.7 cm gallstone seen in the fundus. Sludge: Present Gallbladder Wall: Within normal limits Pericholecystic fluid: None Sonographic Murphy's Sign: Negative per technologist Common bile duct: Diameter: 4 mm Liver: Parenchymal echogenicity: Within normal limits Contours: Normal Lesions: None Portal vein: Patent.  Hepatopetal flow Other: None. IMPRESSION: Cholelithiasis without additional sonographic evidence of acute cholecystitis. Electronically Signed   By: Acquanetta Belling M.D.   On: 09/09/2022 10:03      Assessment/Plan Symptomatic cholelithiasis  Elevated LFTs - RUQ [place] with cholelithiasis and patient has had several episodes of pain  - no leukocytosis and HD stable but Tbili mildly elevated at 1.4, AST/ALT 253/175 - plan OR for laparoscopic cholecystectomy with IOC to rule out choledocholithiasis  -  I have explained the procedure, risks, and aftercare of Laparoscopic cholecystectomy with IOC.  Risks include but are not limited to anesthesia (MI, CVA, death, prolonged intubation and aspiration), bleeding, infection, wound problems, hernia, bile leak, injury to common bile duct/liver/intestine, possible need for subtotal cholecystectomy or open cholecystectomy, increased risk of DVT/PE and diarrhea post op. She seems to understand and agrees to proceed.  - possible discharge from PACU post-operatively   I reviewed ED provider notes, last 24 h vitals and pain scores, last 48 h intake and output, last 24 h labs and trends, and last 24 h imaging results.   Juliet Rude, [facility] 09/09/2022, 12:48 PM Please see Amion for pager number during day hours 7:00am-4:30pm

## 2022-09-09 NOTE — Discharge Instructions (Addendum)

## 2022-09-09 NOTE — Anesthesia Procedure Notes (Signed)
Procedure Name: Intubation Date/Time: 09/09/2022 4:33 PM  Performed by: Marena Chancy, CRNAPre-anesthesia Checklist: Patient identified, Emergency Drugs available, Suction available and Patient being monitored Patient Re-evaluated:Patient Re-evaluated prior to induction Oxygen Delivery Method: Circle System Utilized Preoxygenation: Pre-oxygenation with 100% oxygen Induction Type: IV induction Ventilation: Mask ventilation without difficulty Laryngoscope Size: Miller and 2 Grade View: Grade I Tube type: Oral Tube size: 7.0 mm Number of attempts: 1 Airway Equipment and Method: Stylet and Oral airway Placement Confirmation: ETT inserted through vocal cords under direct vision, positive ETCO2 and breath sounds checked- equal and bilateral Tube secured with: Tape Dental Injury: Teeth and Oropharynx as per pre-operative assessment

## 2022-09-09 NOTE — ED Provider Notes (Addendum)
[place] EMERGENCY DEPARTMENT AT [facility] Provider Note   CSN: [id number] Arrival date & time: 09/09/22  2440     History  Chief Complaint  Patient presents with   Abdominal Pain    Debra Krueger is a 27 y.o. female.  Pt with c/o upper abd pain and nausea. Symptoms acute onset yesterday, dull, occasionally radiates around to back. No vomiting or diarrhea. No fever/chills. Hx same/similar pain on recurrent basis in past several months. No hx pud or pancreatitis. ?gallstones on prior imaging.   The history is provided by the patient and medical records.  Abdominal Pain Associated symptoms: nausea   Associated symptoms: no chest pain, no chills, no cough, no dysuria, no fever and no shortness of breath        Home Medications Prior to Admission medications   Medication Sig Start Date End Date Taking? Authorizing Provider  pantoprazole (PROTONIX) 40 MG tablet Take 40 mg by mouth every morning. Before breakfast 08/01/22 08/01/23 Yes [provider]  FLUoxetine (PROZAC) 20 MG tablet Take 1 tablet (20 mg total) by mouth daily. 04/12/14   Verneda Skill, FNP  ibuprofen (ADVIL,MOTRIN) 600 MG tablet Take 1 tab PO Q6h x 1-2 days then Q6h prn 10/16/13   Lowanda Foster, NP  norgestimate-ethinyl estradiol (SPRINTEC 28) 0.25-35 MG-MCG tablet Take 1 tablet by mouth daily. 08/30/13   Owens Shark, MD      Allergies    Amoxicillin and Penicillins    Review of Systems   Review of Systems  Constitutional:  Negative for chills and fever.  Eyes:  Negative for redness.  Respiratory:  Negative for cough and shortness of breath.   Cardiovascular:  Negative for chest pain.  Gastrointestinal:  Positive for abdominal pain and nausea.  Genitourinary:  Negative for dysuria and flank pain.  Skin:  Negative for rash.    Physical Exam Updated Vital Signs BP (!) 136/92 (BP Location: Right Arm)   Pulse 60   Temp 98.2 F (36.8 C) (Oral)   Resp 15   Ht 1.676 m (5\' 6" )    Wt 82.1 kg   SpO2 100%   BMI 29.21 kg/m  Physical Exam Vitals and nursing note reviewed.  Constitutional:      Appearance: Normal appearance. She is well-developed.  HENT:     Head: Atraumatic.     Nose: Nose normal.     Mouth/Throat:     Mouth: Mucous membranes are moist.  Eyes:     General: No scleral icterus.    Conjunctiva/sclera: Conjunctivae normal.  Neck:     Trachea: No tracheal deviation.  Cardiovascular:     Rate and Rhythm: Normal rate.  Pulmonary:     Effort: Pulmonary effort is normal. No respiratory distress.  Abdominal:     General: Bowel sounds are normal. There is no distension.     Palpations: Abdomen is soft. There is no mass.     Tenderness: There is no abdominal tenderness. There is no guarding.  Genitourinary:    Comments: No cva tenderness.  Musculoskeletal:        General: No swelling or tenderness.     Cervical back: Neck supple. No muscular tenderness.  Skin:    General: Skin is warm and dry.     Findings: No rash.  Neurological:     Mental Status: She is alert.     Comments: Alert, speech normal.   Psychiatric:        Mood and Affect: Mood normal.  ED Results / Procedures / Treatments   Labs (all labs ordered are listed, but only abnormal results are displayed) Results for orders placed or performed during the hospital encounter of 09/09/22  Lipase, blood  Result Value Ref Range   Lipase 16 11 - 51 U/L  Comprehensive metabolic panel  Result Value Ref Range   Sodium 138 135 - 145 mmol/L   Potassium 3.9 3.5 - 5.1 mmol/L   Chloride 104 98 - 111 mmol/L   CO2 23 22 - 32 mmol/L   Glucose, Bld 114 (H) 70 - 99 mg/dL   BUN 5 (L) 6 - 20 mg/dL   Creatinine, Ser 7.82 0.44 - 1.00 mg/dL   Calcium 9.6 8.9 - 95.6 mg/dL   Total Protein 7.4 6.5 - 8.1 g/dL   Albumin 4.5 3.5 - 5.0 g/dL   AST 213 (H) 15 - 41 U/L   ALT 175 (H) 0 - 44 U/L   Alkaline Phosphatase 79 38 - 126 U/L   Total Bilirubin 1.4 (H) 0.3 - 1.2 mg/dL   GFR, Estimated >08 >65  mL/min   Anion gap 11 5 - 15  CBC  Result Value Ref Range   WBC 7.5 4.0 - 10.5 K/uL   RBC 4.81 3.87 - 5.11 MIL/uL   Hemoglobin 14.2 12.0 - 15.0 g/dL   HCT 78.4 69.6 - 29.5 %   MCV 86.3 80.0 - 100.0 fL   MCH 29.5 26.0 - 34.0 pg   MCHC 34.2 30.0 - 36.0 g/dL   RDW 28.4 13.2 - 44.0 %   Platelets 156 150 - 400 K/uL   nRBC 0.0 0.0 - 0.2 %  Urinalysis, Routine w reflex microscopic -Urine, Clean Catch  Result Value Ref Range   Color, Urine YELLOW YELLOW   APPearance HAZY (A) CLEAR   Specific Gravity, Urine 1.023 1.005 - 1.030   pH 8.5 (H) 5.0 - 8.0   Glucose, UA NEGATIVE NEGATIVE mg/dL   Hgb urine dipstick NEGATIVE NEGATIVE   Bilirubin Urine NEGATIVE NEGATIVE   Ketones, ur 40 (A) NEGATIVE mg/dL   Protein, ur 30 (A) NEGATIVE mg/dL   Nitrite NEGATIVE NEGATIVE   Leukocytes,Ua TRACE (A) NEGATIVE   RBC / HPF 0-5 0 - 5 RBC/hpf   WBC, UA 6-10 0 - 5 WBC/hpf   Bacteria, UA RARE (A) NONE SEEN   Squamous Epithelial / HPF 6-10 0 - 5 /HPF   Mucus PRESENT    Crystals PRESENT (A) NEGATIVE  Pregnancy, urine  Result Value Ref Range   Preg Test, Ur NEGATIVE NEGATIVE    EKG None  Radiology No results found.  Procedures Procedures    Medications Ordered in ED Medications  lactated ringers bolus 1,000 mL (has no administration in time range)  morphine (PF) 4 MG/ML injection 4 mg (has no administration in time range)  ondansetron (ZOFRAN) injection 4 mg (has no administration in time range)    ED Course/ Medical Decision Making/ A&P                                 Medical Decision Making Problems Addressed: Elevated liver function tests: acute illness or injury with systemic symptoms that poses a threat to life or bodily functions Gallstones: acute illness or injury    Details: Acute/chronic Upper abdominal pain: acute illness or injury with systemic symptoms that poses a threat to life or bodily functions  Amount and/or Complexity of Data Reviewed Independent Historian:  Details: Friend/family, hx External Data Reviewed: labs, radiology and notes. Labs: ordered. Decision-making details documented in ED Course. Radiology: ordered and independent interpretation performed. Decision-making details documented in ED Course.  Risk Prescription drug management. Decision regarding hospitalization.   Iv ns. Continuous pulse ox and cardiac monitoring. Labs ordered/sent. Imaging ordered.   Differential diagnosis includes gallstones, biliary obstruction, pud, pancreatitis, cannabinoid hyperemesis/abd pain syndrome, etc. Dispo decision including potential need for admission considered - will get labs and imaging and reassess.   Reviewed nursing notes and prior charts for additional history. External reports reviewed.   Cardiac monitor: sinus rhythm, rate 66.  Labs reviewed/interpreted by me - wbc normal. Ast/alt/bili high. Will get u/s.   U/S reviewed/interpreted by me -  gallstones.   Discussed labs and imaging with pt. General surgery consulted - discussed pt with on-call provider including elev lfts, u/s, etc - he indicates can admit for OR, cholecystectomy, intra-op cholangiogram, etc.  Discussed with patient, pt not willing to stay for admission or OR.  Discussed concern re elevated lfts, bile duct obstruction, risk of further complication, infection, worsening biliary obstruction, further pain and nv - pt voices understanding, concern and risk, but persists in request for d/c from ED, pt electing to leave AMA while understanding recommendations and risk.   Pt remains pain free on recheck, abd soft non tender, afebrile, stable vital signs.   After reconsideration, pt indicates now willing to be admitted and have surgery. General surgery recontacted.            Final Clinical Impression(s) / ED Diagnoses Final diagnoses:  None    Rx / DC Orders ED Discharge Orders     None           Cathren Laine, MD 09/09/22 1126

## 2022-09-09 NOTE — ED Triage Notes (Signed)
Pt arrives to ED with c/u RUQ abdominal pain that started at 3am this morning. Hx gallstones.

## 2022-09-10 ENCOUNTER — Encounter (HOSPITAL_COMMUNITY): Payer: Self-pay | Admitting: Surgery

## 2022-09-10 DIAGNOSIS — K8012 Calculus of gallbladder with acute and chronic cholecystitis without obstruction: Secondary | ICD-10-CM | POA: Diagnosis not present

## 2022-09-10 LAB — HEPATIC FUNCTION PANEL
ALT: 273 U/L — ABNORMAL HIGH (ref 0–44)
AST: 202 U/L — ABNORMAL HIGH (ref 15–41)
Albumin: 3.5 g/dL (ref 3.5–5.0)
Alkaline Phosphatase: 95 U/L (ref 38–126)
Bilirubin, Direct: 0.3 mg/dL — ABNORMAL HIGH (ref 0.0–0.2)
Indirect Bilirubin: 0.7 mg/dL (ref 0.3–0.9)
Total Bilirubin: 1 mg/dL (ref 0.3–1.2)
Total Protein: 6.3 g/dL — ABNORMAL LOW (ref 6.5–8.1)

## 2022-09-10 LAB — HIV ANTIBODY (ROUTINE TESTING W REFLEX): HIV Screen 4th Generation wRfx: NONREACTIVE

## 2022-09-10 MED ORDER — ACETAMINOPHEN 500 MG PO TABS: 1000.0000 mg | ORAL_TABLET | Freq: Four times a day (QID) | ORAL | Status: AC | PRN

## 2022-09-10 MED ORDER — OXYCODONE HCL 5 MG PO TABS: 5.0000 mg | ORAL_TABLET | Freq: Four times a day (QID) | ORAL | 0 refills | Status: AC | PRN

## 2022-09-10 MED ORDER — IBUPROFEN 800 MG PO TABS: 800.0000 mg | ORAL_TABLET | Freq: Three times a day (TID) | ORAL | 0 refills | Status: AC | PRN

## 2022-09-10 NOTE — Progress Notes (Signed)
Progress Note: General Surgery Service   Chief Complaint/Subjective: Feeling okay this morning.  Lots of pain after surgery  Objective: Vital signs in last 24 hours: Temp:  [97.7 F (36.5 C)-98.7 F (37.1 C)] 98.7 F (37.1 C) (08/07 0759) Pulse Rate:  [49-97] 52 (08/07 0759) Resp:  [11-21] 18 (08/07 0759) BP: (108-130)/(61-82) 120/65 (08/07 0759) SpO2:  [96 %-100 %] 98 % (08/07 0759) Last BM Date : 09/08/22  Intake/Output from previous day: 08/06 0701 - 08/07 0700 In: 1100 [I.V.:1000; IV Piggyback:100] Out: 25 [Blood:25] Intake/Output this shift: No intake/output data recorded.  Constitutional: NAD; conversant; no deformities Eyes: Moist conjunctiva; no lid lag; anicteric; PERRL Neck: Trachea midline; no thyromegaly Lungs: Normal respiratory effort; no tactile fremitus CV: RRR; no palpable thrills; no pitting edema GI: Abd Soft, tender around incisions; no palpable hepatosplenomegaly MSK: Normal range of motion of extremities; no clubbing/cyanosis Psychiatric: Appropriate affect; alert and oriented x3 Lymphatic: No palpable cervical or axillary lymphadenopathy  Lab Results: CBC  Recent Labs    09/09/22 0850 09/10/22 0019  WBC 7.5 5.9  HGB 14.2 13.4  HCT 41.5 40.1  PLT 156 136*   BMET Recent Labs    09/09/22 0850 09/10/22 0019  NA 138 136  K 3.9 4.0  CL 104 101  CO2 23 22  GLUCOSE 114* 182*  BUN 5* <5*  CREATININE 0.58 0.71  CALCIUM 9.6 9.3   PT/INR No results for input(s): "LABPROT", "INR" in the last 72 hours. ABG No results for input(s): "PHART", "HCO3" in the last 72 hours.  Invalid input(s): "PCO2", "PO2"  Anti-infectives: Anti-infectives (From admission, onward)    Start     Dose/Rate Route Frequency Ordered Stop   09/09/22 1500  ceFAZolin (ANCEF) IVPB 2g/100 mL premix        2 g 200 mL/hr over 30 Minutes Intravenous  Once 09/09/22 1447 09/09/22 1705       Medications: Scheduled Meds:  acetaminophen  1,000 mg Oral Q6H   enoxaparin  (LOVENOX) injection  40 mg Subcutaneous Q24H   ketorolac  30 mg Intravenous Q6H   Continuous Infusions:  dextrose 5 % and 0.45 % NaCl with KCl 20 mEq/L 50 mL/hr at 09/09/22 1901   PRN Meds:.diphenhydrAMINE **OR** diphenhydrAMINE, hydrALAZINE, melatonin, metoprolol tartrate, morphine injection, ondansetron **OR** ondansetron (ZOFRAN) IV, oxyCODONE, polyethylene glycol, simethicone  Assessment/Plan: s/p Procedure(s): LAPAROSCOPIC CHOLECYSTECTOMY WITH INTRAOPERATIVE CHOLANGIOGRAM 09/09/2022  Looked to have a bubble on the IOC, contast flowed freely through ampula LFTs mildly elevated this morning Recheck LFTs this afternoon to decide if GI consult or MRCP may be necessary Pain control Diet as tolerated    LOS: 0 days   Quentin Ore, MD  [facility] Use [email or web address] to contact on call provider  Daily Billing: [id number] - post op

## 2022-09-11 NOTE — Discharge Summary (Signed)
[facility] Discharge Summary   Patient ID: Debra Krueger MRN: [id number] DOB/AGE: [birth year] 25 y.o.  Admit date: 09/09/2022 Discharge date: 09/10/2022  Admitting Diagnosis: Acute cholecystitis   Discharge Diagnosis S/p laparoscopic cholecystectomy   Consultants None   Imaging: DG Cholangiogram Operative  Result Date: 09/10/2022 CLINICAL DATA:  [id number] Surgery, elective [id number] EXAM: INTRAOPERATIVE CHOLANGIOGRAM COMPARISON:  CT CAP, 10/16/2013.  US Abdomen, 09/09/2022 FLUOROSCOPY: Exposure Index (as provided by the fluoroscopic device): 2.3 mGy Kerma FINDINGS: Limited oblique planar images of the RIGHT upper quadrant obtained C-arm. Images demonstrating laparoscopic instrumentation, cystic duct cannulation and antegrade cholangiogram. Free spillage of contrast into the duodenum. No biliary ductal dilation. No evidence of biliary filling defect is demonstrated. Trace layering contrast along the gallbladder fossa. IMPRESSION: Fluoroscopic imaging for intraoperative cholangiogram. No biliary ductal dilation or filling defect is demonstrated. For complete description of intra procedural findings, please see performing service dictation. Electronically Signed   By: Roanna Banning M.D.   On: 09/10/2022 07:47    Procedures Dr. Renae Fickle Stechschulte (09/09/22) - Laparoscopic Cholecystectomy with [facility]   Hospital Course:  Patient is a 27 year old female who presented to the ED with abdominal pain and known gallstones.  Workup showed possible cholecystitis and elevated LFTs.  Patient was admitted and underwent procedure listed above.  Tolerated procedure well and was transferred to the floor.  Diet was advanced as tolerated.  On POD1, the patient was voiding well, tolerating diet, ambulating well, pain well controlled, vital signs stable, incisions c/d/i and felt stable for discharge home.  Patient will follow up in our office in 3-4 weeks and knows to call with questions or concerns. She will  call to confirm appointment date/time.    Physical Exam: General:  Alert, NAD, pleasant, comfortable Abd:  Soft, ND, mild tenderness, incisions C/D/I  I or a member of my team have reviewed this patient in the Controlled Substance Database.   Allergies as of 09/10/2022       Reactions   Amoxicillin Hives   Omeprazole Hives   Penicillins Hives        Medication List     TAKE these medications    acetaminophen 500 MG tablet Commonly known as: TYLENOL Take 2 tablets (1,000 mg total) by mouth every 6 (six) hours as needed for mild pain, fever or headache.   ibuprofen 800 MG tablet Commonly known as: ADVIL Take 1 tablet (800 mg total) by mouth every 8 (eight) hours as needed. TAKE WITH FOOD.   oxyCODONE 5 MG immediate release tablet Commonly known as: Oxy IR/ROXICODONE Take 1 tablet (5 mg total) by mouth every 6 (six) hours as needed for moderate pain or severe pain.          Follow-up Information     Maczis, Hedda Slade, PA-C Follow up on 09/30/2022.   Specialty: General Surgery Why: 10:45 am, Arrive 30 minutes prior to your appointment time, Please bring your insurance card and photo ID Contact information: [address] [place] [address] [facility] [place] [address] [phone]                 Signed: [name] , [facility] 09/11/2022, 10:42 AM Please see Amion for pager number during day hours 7:00am-4:30pm
